# Patient Record
Sex: Male | Born: 1990 | Race: White | Hispanic: Yes | Marital: Married | Smoking: Current every day smoker
Health system: Southern US, Community
[De-identification: ages and names within clinical notes are randomized; demographics above are authoritative.]

## PROBLEM LIST (undated history)

## (undated) DIAGNOSIS — L409 Psoriasis, unspecified: Secondary | ICD-10-CM

---

## 2009-10-28 ENCOUNTER — Emergency Department: Payer: Self-pay | Admitting: Emergency Medicine

## 2013-07-30 ENCOUNTER — Emergency Department: Payer: Self-pay | Admitting: Emergency Medicine

## 2013-07-30 LAB — URINALYSIS, COMPLETE
Bacteria: NONE SEEN
Bilirubin,UR: NEGATIVE
Blood: NEGATIVE
Glucose,UR: NEGATIVE mg/dL (ref 0–75)
Ketone: NEGATIVE
Leukocyte Esterase: NEGATIVE
Nitrite: NEGATIVE
RBC,UR: 2 /HPF (ref 0–5)
Specific Gravity: 1.021 (ref 1.003–1.030)
Squamous Epithelial: <1

## 2015-02-10 ENCOUNTER — Emergency Department
Admit: 2015-02-10 | Disposition: A | Discharge status: Home / Self Care | Payer: Self-pay | Admitting: Emergency Medicine

## 2015-02-10 LAB — URINALYSIS, COMPLETE
BACTERIA: NONE SEEN
BILIRUBIN, UR: NEGATIVE
Blood: NEGATIVE
GLUCOSE, UR: NEGATIVE mg/dL (ref 0–75)
KETONE: NEGATIVE
Leukocyte Esterase: NEGATIVE
Nitrite: NEGATIVE
Ph: 6 (ref 4.5–8.0)
Protein: NEGATIVE
SPECIFIC GRAVITY: 1.026 (ref 1.003–1.030)

## 2015-04-20 ENCOUNTER — Encounter: Payer: Self-pay | Admitting: Emergency Medicine

## 2015-04-20 ENCOUNTER — Emergency Department
Admission: EM | Admit: 2015-04-20 | Discharge: 2015-04-20 | Disposition: A | Discharge status: Home / Self Care | Payer: Self-pay | Attending: Emergency Medicine | Admitting: Emergency Medicine

## 2015-04-20 DIAGNOSIS — L408 Other psoriasis: Secondary | ICD-10-CM

## 2015-04-20 DIAGNOSIS — L409 Psoriasis, unspecified: Secondary | ICD-10-CM | POA: Insufficient documentation

## 2015-04-20 DIAGNOSIS — Z720 Tobacco use: Secondary | ICD-10-CM | POA: Insufficient documentation

## 2015-04-20 DIAGNOSIS — L218 Other seborrheic dermatitis: Secondary | ICD-10-CM | POA: Insufficient documentation

## 2015-04-20 DIAGNOSIS — R109 Unspecified abdominal pain: Secondary | ICD-10-CM | POA: Insufficient documentation

## 2015-04-20 LAB — URINALYSIS COMPLETE WITH MICROSCOPIC (ARMC ONLY)
BACTERIA UA: NONE SEEN
BILIRUBIN URINE: NEGATIVE
GLUCOSE, UA: NEGATIVE mg/dL
HGB URINE DIPSTICK: NEGATIVE
KETONES UR: NEGATIVE mg/dL
LEUKOCYTES UA: NEGATIVE
Nitrite: NEGATIVE
Protein, ur: NEGATIVE mg/dL
Specific Gravity, Urine: 1.02 (ref 1.005–1.030)
pH: 5 (ref 5.0–8.0)

## 2015-04-20 MED ORDER — IBUPROFEN 800 MG PO TABS: 800.0000 mg | ORAL_TABLET | Freq: Three times a day (TID) | ORAL | Status: AC | PRN

## 2015-04-20 MED ORDER — CYCLOBENZAPRINE HCL 10 MG PO TABS: 10.0000 mg | ORAL_TABLET | Freq: Three times a day (TID) | ORAL | Status: DC | PRN

## 2015-04-20 NOTE — ED Notes (Signed)
Pt to ed with c/o intermittent pain in lower back x 2 years.  Pt states recently has been lifting heavy objects at work and the pain has returned. Pt ambulates with ease.

## 2015-04-20 NOTE — Discharge Instructions (Signed)
Flank Pain °Flank pain is pain in your side. The flank is the area of your side between your upper belly (abdomen) and your back. Pain in this area can be caused by many different things. °HOME CARE °Home care and treatment will depend on the cause of your pain. °· Rest as told by your doctor. °· Drink enough fluids to keep your pee (urine) clear or pale yellow.   °· Only take medicine as told by your doctor. °· Tell your doctor about any changes in your pain. °· Follow up with your doctor. °GET HELP RIGHT AWAY IF:  °· Your pain does not get better with medicine.   °· You have new symptoms or your symptoms get worse. °· Your pain gets worse.   °· You have belly (abdominal) pain.   °· You are short of breath.   °· You always feel sick to your stomach (nauseous).   °· You keep throwing up (vomiting).   °· You have puffiness (swelling) in your belly.   °· You feel light-headed or you pass out (faint).   °· You have blood in your pee. °· You have a fever or lasting symptoms for more than 2-3 days. °· You have a fever and your symptoms suddenly get worse. °MAKE SURE YOU:  °· Understand these instructions. °· Will watch your condition. °· Will get help right away if you are not doing well or get worse. °Document Released: 07/25/2008 Document Revised: 03/02/2014 Document Reviewed: 05/30/2012 °ExitCare® Patient Information ©2015 [employer]. This information is not intended to replace advice given to you by your health care provider. Make sure you discuss any questions you have with your health care provider. ° °

## 2015-04-20 NOTE — ED Provider Notes (Signed)
[facility] Emergency Department Provider Note  ____________________________________________  Time seen: Approximately 7:30 AM  I have reviewed the triage vital signs and the nursing notes.   HISTORY  Chief Complaint Back Pain    HPI Ronnie Garner is a 24 y.o. male complaining of low back pain for 2 years. Patient state recently he has performed heavy lifting at work and the pain has returned. Patient is located in the right flank. Patient denies any radicular component to this pain he denies any bladder or bowel dysfunction. No palliative measures taken since onset. Patient is rating his pain as 8/10.   History reviewed. No pertinent past medical history.  There are no active problems to display for this patient.   History reviewed. No pertinent past surgical history.  Current Outpatient Rx  Name  Route  Sig  Dispense  Refill  . cyclobenzaprine (FLEXERIL) 10 MG tablet   Oral   Take 1 tablet (10 mg total) by mouth every 8 (eight) hours as needed for muscle spasms.   15 tablet   0   . ibuprofen (ADVIL,MOTRIN) 800 MG tablet   Oral   Take 1 tablet (800 mg total) by mouth every 8 (eight) hours as needed for moderate pain.   15 tablet   0     Allergies Review of patient's allergies indicates no known allergies.  History reviewed. No pertinent family history.  Social History History  Substance Use Topics  . Smoking status: Current Every Day Smoker  . Smokeless tobacco: Not on file  . Alcohol Use: Yes    Review of Systems Constitutional: No fever/chills Eyes: No visual changes. ENT: No sore throat. Cardiovascular: Denies chest pain. Respiratory: Denies shortness of breath. Gastrointestinal: No abdominal pain.  No nausea, no vomiting.  No diarrhea.  No constipation. Genitourinary: Negative for dysuria. Musculoskeletal: Right flank pain Skin: Positive for rash for head behind the ears and bilateral knee Neurological: Negative for  headaches, focal weakness or numbness. 10-point ROS otherwise negative.  ____________________________________________   PHYSICAL EXAM:  VITAL SIGNS: ED Triage Vitals  Enc Vitals Group     BP 04/20/15 0716 145/82 mmHg     Pulse Rate 04/20/15 0716 62     Resp 04/20/15 0716 18     Temp 04/20/15 0716 97.7 F (36.5 C)     Temp Source 04/20/15 0716 Oral     SpO2 04/20/15 0716 96 %     Weight 04/20/15 0716 220 lb (99.791 kg)     Height 04/20/15 0716  (1.702 m)     Head Cir --      Peak Flow --      Pain Score 04/20/15 0717 8     Pain Loc --      Pain Edu? --      Excl. in GC? --     Constitutional: Alert and oriented. Well appearing and in no acute distress. Eyes: Conjunctivae are normal. PERRL. EOMI. Head: Atraumatic. Nose: No congestion/rhinnorhea. Mouth/Throat: Mucous membranes are moist.  Oropharynx non-ery  No cervical spine tenderness to palpation. Neck: No stridor.  Hematological/Lymphatic/Immunilogical: No cervical lymphadenopathy. Cardiovascular: Normal rate, regular rhythm. Grossly normal heart sounds.  Good peripheral circulation. Respiratory: Normal respiratory effort.  No retractions. Lungs CTAB. Gastrointestinal: Soft and nontender. No distention. No abdominal bruits. Left CVA tenderness.  Musculoskeletal: No lower extremity tenderness nor edema.  No joint effusions. Neurologic:  Normal speech and language. No gross focal neurologic deficits are appreciated. Speech is normal. No gait instability.  Skin:  Skin is warm, dry and intact. Macular scaly rash on the forehead bilaterally ears and knees. Psychiatric: Mood and affect are normal. Speech and behavior are normal.  ____________________________________________   LABS (all labs ordered are listed, but only abnormal results are displayed)  Labs Reviewed  URINALYSIS COMPLETEWITH MICROSCOPIC ([facility] ONLY) - Abnormal; Notable for the following:    Color, Urine YELLOW (*)    APPearance CLEAR (*)    Squamous  Epithelial / LPF 0-5 (*)    All other components within normal limits   ____________________________________________  EKG   ____________________________________________  RADIOLOGY   ____________________________________________   PROCEDURES  Procedure(s) performed: None  Critical Care performed: No  ____________________________________________   INITIAL IMPRESSION / ASSESSMENT AND PLAN / ED COURSE  Pertinent labs & imaging results that were available during my care of the patient were reviewed by me and considered in my medical decision making (see chart for details).  Right flank pain.  ____________________________________________   FINAL CLINICAL IMPRESSION(S) / ED DIAGNOSES  Final diagnoses:  Right flank pain  Psoriasiform seborrheic dermatitis      Joni Reining, PA-C 04/20/15 1751  Gayla Doss, MD 04/20/15 [phone]

## 2015-04-20 NOTE — ED Notes (Signed)
Assessment per PA 

## 2015-06-08 ENCOUNTER — Emergency Department
Admission: EM | Admit: 2015-06-08 | Discharge: 2015-06-08 | Disposition: A | Discharge status: Home / Self Care | Payer: Self-pay | Attending: Emergency Medicine | Admitting: Emergency Medicine

## 2015-06-08 ENCOUNTER — Encounter: Payer: Self-pay | Admitting: Emergency Medicine

## 2015-06-08 DIAGNOSIS — M549 Dorsalgia, unspecified: Secondary | ICD-10-CM

## 2015-06-08 DIAGNOSIS — M545 Low back pain: Secondary | ICD-10-CM | POA: Insufficient documentation

## 2015-06-08 DIAGNOSIS — R109 Unspecified abdominal pain: Secondary | ICD-10-CM | POA: Insufficient documentation

## 2015-06-08 DIAGNOSIS — G8929 Other chronic pain: Secondary | ICD-10-CM | POA: Insufficient documentation

## 2015-06-08 DIAGNOSIS — Z720 Tobacco use: Secondary | ICD-10-CM | POA: Insufficient documentation

## 2015-06-08 LAB — URINALYSIS COMPLETE WITH MICROSCOPIC (ARMC ONLY)
BACTERIA UA: NONE SEEN
BILIRUBIN URINE: NEGATIVE
GLUCOSE, UA: NEGATIVE mg/dL
HGB URINE DIPSTICK: NEGATIVE
KETONES UR: NEGATIVE mg/dL
Leukocytes, UA: NEGATIVE
NITRITE: NEGATIVE
PROTEIN: 30 mg/dL — AB
SPECIFIC GRAVITY, URINE: 1.028 (ref 1.005–1.030)
pH: 5 (ref 5.0–8.0)

## 2015-06-08 MED ORDER — METHOCARBAMOL 500 MG PO TABS: 1500.0000 mg | ORAL_TABLET | Freq: Three times a day (TID) | ORAL | Status: AC | PRN

## 2015-06-08 MED ORDER — TRAMADOL HCL 50 MG PO TABS: 50.0000 mg | ORAL_TABLET | Freq: Four times a day (QID) | ORAL | Status: AC | PRN

## 2015-06-08 NOTE — ED Provider Notes (Signed)
[facility] Emergency Department Provider Note ____________________________________________  Time seen: Approximately 7:28 AM  I have reviewed the triage vital signs and the nursing notes.   HISTORY  Chief Complaint Flank Pain and Back Pain   HPI Ronnie Garner is a 24 y.o. male who presents to the emergency department for evaluation of right lower back pain. He states that this pain is been present off and on for the past 2 years. He states that he has been here before for the same pain. He states that he has had negative x-rays and we have tested his urine every time but we have found nothing wrong. He states that he has been taking is ibuprofen and using pain patches with minimal relief. He states typically 2 days off work is enough to relieve the pain. Pain is worse with movement.   History reviewed. No pertinent past medical history.  There are no active problems to display for this patient.   History reviewed. No pertinent past surgical history.  Current Outpatient Rx  Name  Route  Sig  Dispense  Refill  . ibuprofen (ADVIL,MOTRIN) 800 MG tablet   Oral   Take 1 tablet (800 mg total) by mouth every 8 (eight) hours as needed for moderate pain.   15 tablet   0   . methocarbamol (ROBAXIN) 500 MG tablet   Oral   Take 3 tablets (1,500 mg total) by mouth every 8 (eight) hours as needed for muscle spasms.   30 tablet   0   . traMADol (ULTRAM) 50 MG tablet   Oral   Take 1 tablet (50 mg total) by mouth every 6 (six) hours as needed.   12 tablet   0     Allergies Review of patient's allergies indicates no known allergies.  No family history on file.  Social History History  Substance Use Topics  . Smoking status: Current Every Day Smoker -- 0.50 packs/day    Types: Cigarettes  . Smokeless tobacco: Never Used  . Alcohol Use: Yes    Review of Systems Constitutional: No recent illness. Eyes: No visual changes. ENT: No sore  throat. Cardiovascular: Denies chest pain or palpitations. Respiratory: Denies shortness of breath. Gastrointestinal: No abdominal pain.  Genitourinary: Negative for dysuria. Musculoskeletal: Pain in right lower back without radiation. Skin: Negative for rash. Neurological: Negative for headaches, focal weakness or numbness. 10-point ROS otherwise negative.  ____________________________________________   PHYSICAL EXAM:  VITAL SIGNS: ED Triage Vitals  Enc Vitals Group     BP 06/08/15 0644 142/78 mmHg     Pulse Rate 06/08/15 0644 65     Resp 06/08/15 0644 18     Temp 06/08/15 0644 98.3 F (36.8 C)     Temp Source 06/08/15 0644 Oral     SpO2 06/08/15 0644 97 %     Weight 06/08/15 0644 215 lb (97.523 kg)     Height 06/08/15 0644 5\' 7"  (1.702 m)     Head Cir --      Peak Flow --      Pain Score 06/08/15 0645 8     Pain Loc --      Pain Edu? --      Excl. in GC? --     Constitutional: Alert and oriented. Well appearing and in no acute distress. Eyes: Conjunctivae are normal. EOMI. Head: Atraumatic. Nose: No congestion/rhinnorhea. Neck: No stridor.  Respiratory: Normal respiratory effort.   Musculoskeletal: No tenderness upon palpation. Straight leg raise is negative.  Neurologic:  Normal speech and language. No gross focal neurologic deficits are appreciated. Speech is normal. No gait instability. Skin:  Skin is warm, dry and intact. Atraumatic. Psychiatric: Mood and affect are normal. Speech and behavior are normal.  ____________________________________________   LABS (all labs ordered are listed, but only abnormal results are displayed)  Labs Reviewed  URINALYSIS COMPLETEWITH MICROSCOPIC ([facility] ONLY) - Abnormal; Notable for the following:    Color, Urine YELLOW (*)    APPearance CLEAR (*)    Protein, ur 30 (*)    Squamous Epithelial / LPF 0-5 (*)    All other components within normal limits   ____________________________________________  RADIOLOGY  Not  indicated ____________________________________________   PROCEDURES  Procedure(s) performed: None   ____________________________________________   INITIAL IMPRESSION / ASSESSMENT AND PLAN / ED COURSE  Pertinent labs & imaging results that were available during my care of the patient were reviewed by me and considered in my medical decision making (see chart for details).  Prescriptions for Robaxin and Tylenol written. Patient is to follow-up with orthopedics or a spine specialist for further evaluation. He was advised to return to the emergency department for symptoms that change or worsen in left is unable to schedule an appointment. ____________________________________________   FINAL CLINICAL IMPRESSION(S) / ED DIAGNOSES  Final diagnoses:  Chronic back pain greater than 3 months duration      Chinita Pester, FNP 06/08/15 1610  Sharman Cheek, MD 06/08/15 1348

## 2015-06-08 NOTE — ED Notes (Signed)
Pt presents to ED with right sided flank/back pain with slight nausea. Pt denies known injury. reports pain has been ongoing for the past 2 years but worsened last night around 1800. Pt states he has been seen in this ED for the same and had kidney stones ruled out. States his MD is not sure what is causing his pain. Pt states since he was hurting so badly this morning he didn't want to go to work so he came in here instead. Pt alert and calm at this time with on increased work of breathing or acute distress noted.

## 2015-06-08 NOTE — ED Notes (Signed)
States he has had intermittent right lower back,flank pain for about 2 mos. Min relief with ibuprofen and patches. Denies recent injury or urinary sx's. Ambulates well to treatment room

## 2015-09-21 IMAGING — CR DG THORACIC SPINE 2-3V
1 series · 4 of 4 positions shown · non-contrast
Comparison: None.

CLINICAL DATA: Chronic dorsalgia.  No recent trauma

EXAM:
THORACIC SPINE - 3 VIEW

[Series 1: t thoracic spine ap · 0.14mm/px · 4 of 4 slices shown]
[im 1/4]
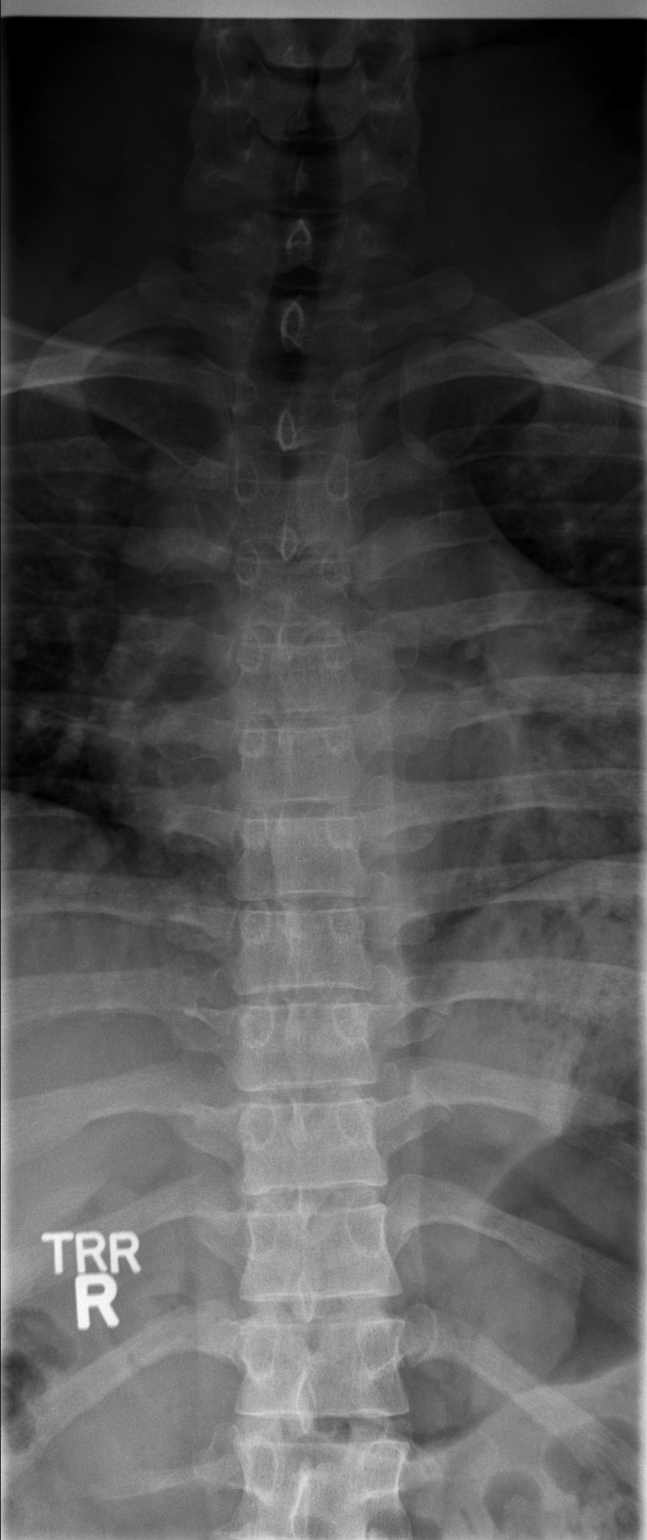
[im 2/4]
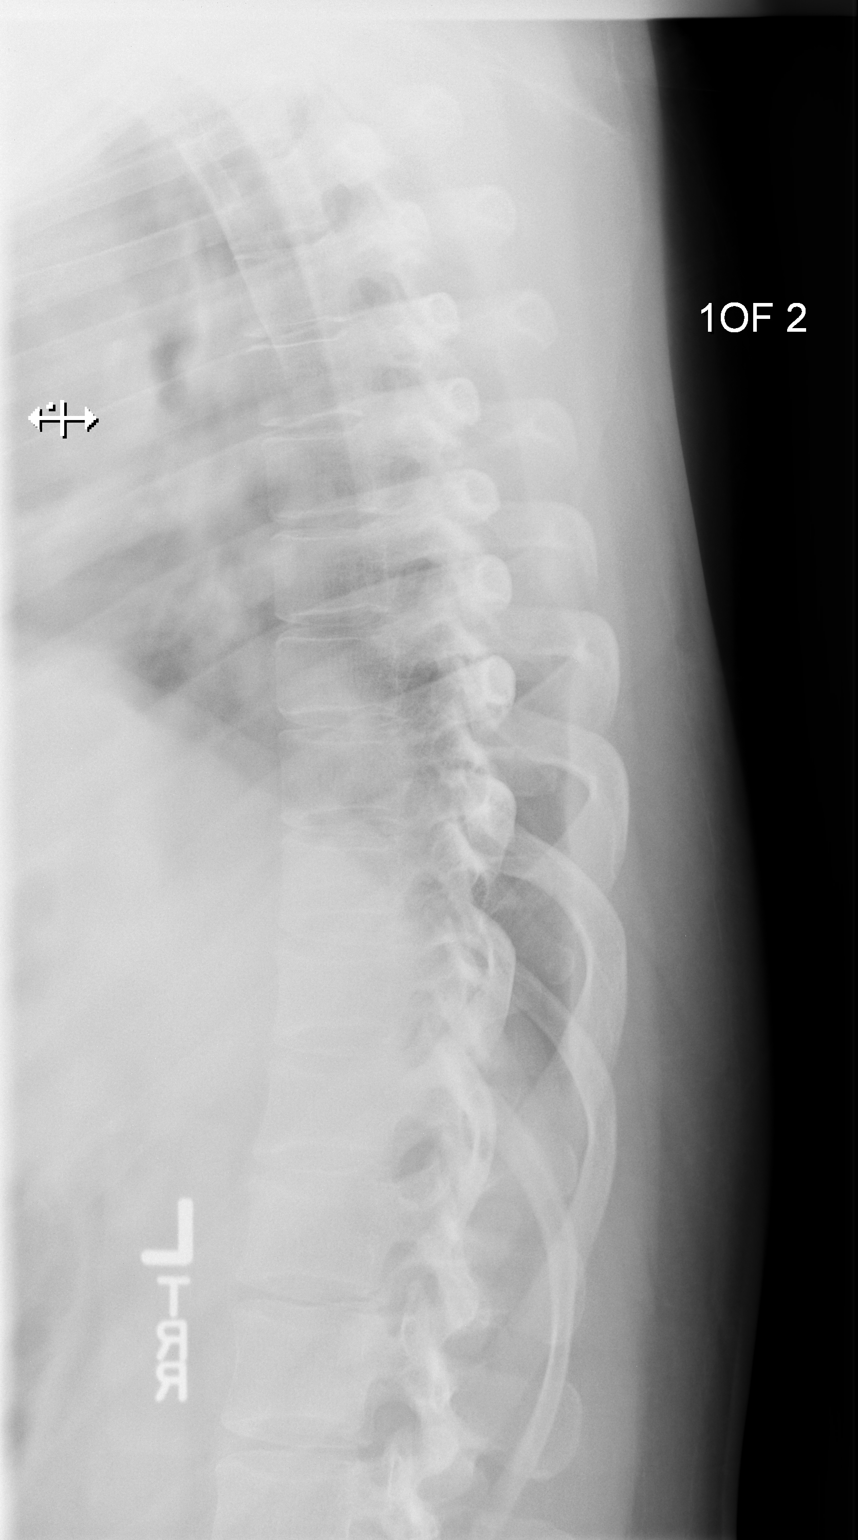
[im 3/4]
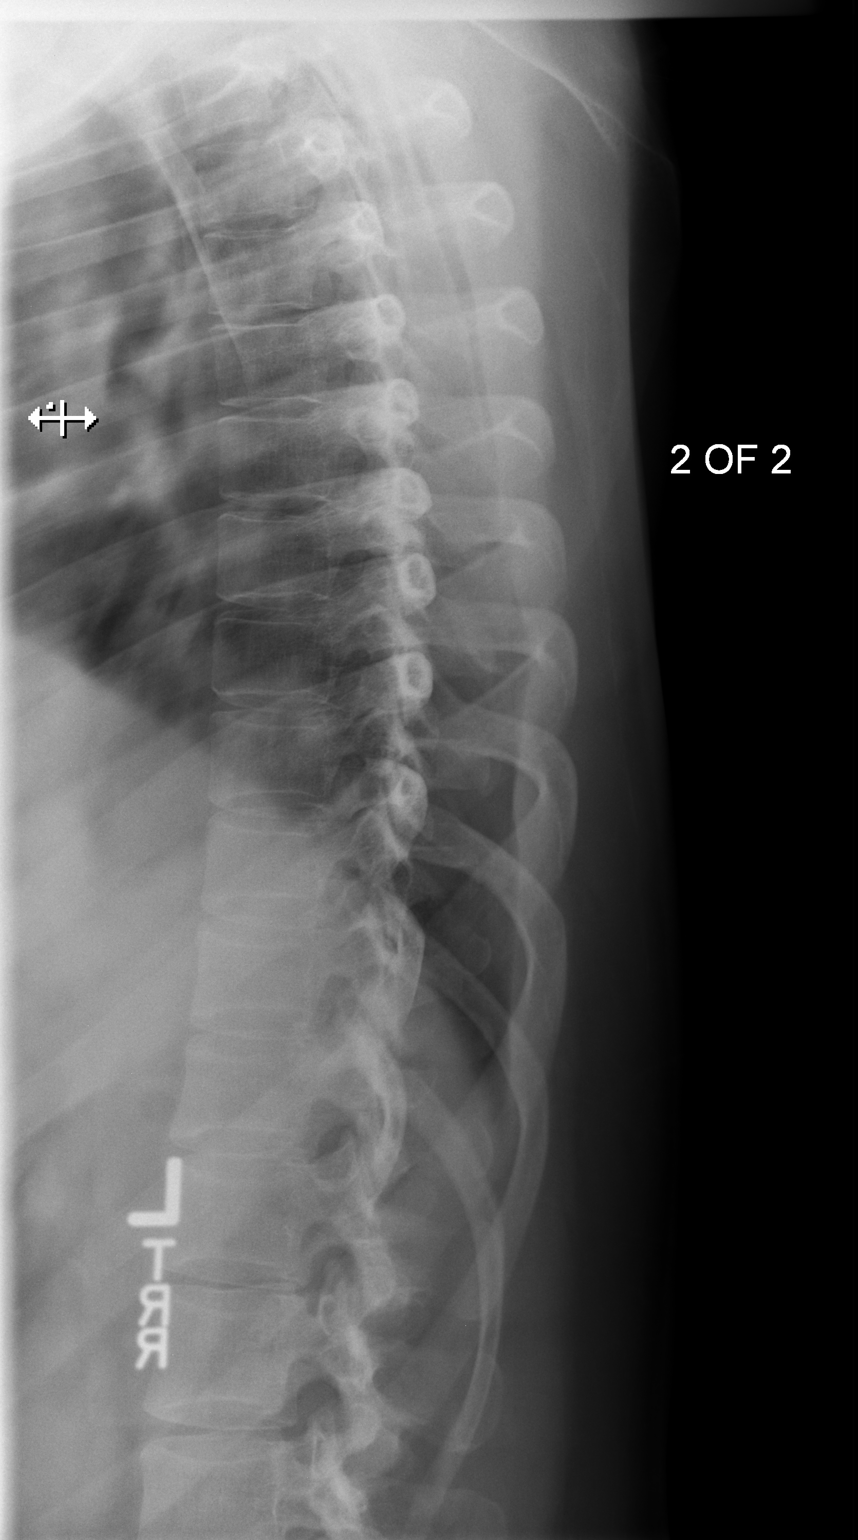
[im 4/4]
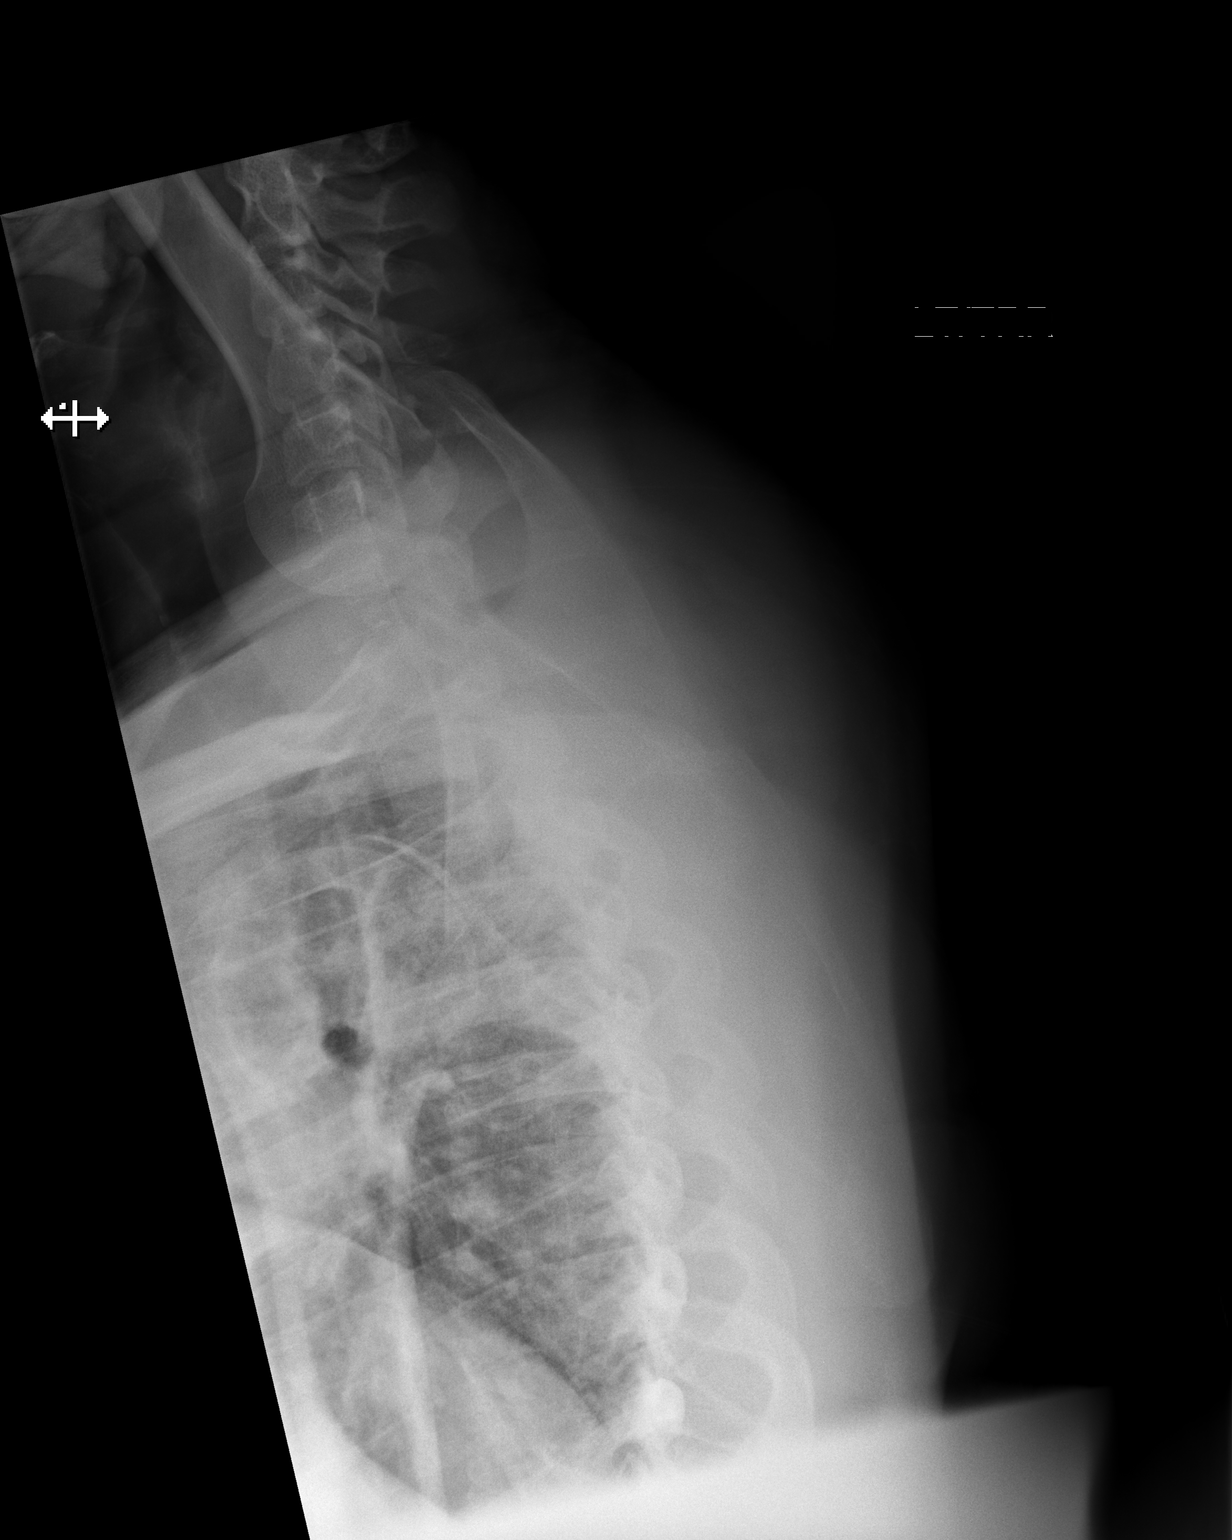

[4 of 4 positions shown; findings below may reference images not displayed]

FINDINGS: Frontal, lateral, and swimmer's views were obtained. There is no
fracture or spondylolisthesis. Disc spaces appear intact. No erosive
change.
IMPRESSION: No fracture or spondylolisthesis.  No appreciable arthropathy.

## 2017-05-21 DIAGNOSIS — Z79899 Other long term (current) drug therapy: Secondary | ICD-10-CM | POA: Diagnosis not present

## 2017-05-21 DIAGNOSIS — L400 Psoriasis vulgaris: Secondary | ICD-10-CM | POA: Diagnosis not present

## 2017-09-28 DIAGNOSIS — Z0000 Encounter for general adult medical examination without abnormal findings: Secondary | ICD-10-CM | POA: Diagnosis not present

## 2017-09-28 DIAGNOSIS — Z7689 Persons encountering health services in other specified circumstances: Secondary | ICD-10-CM | POA: Diagnosis not present

## 2017-09-28 DIAGNOSIS — L308 Other specified dermatitis: Secondary | ICD-10-CM | POA: Diagnosis not present

## 2018-06-25 DIAGNOSIS — L400 Psoriasis vulgaris: Secondary | ICD-10-CM | POA: Diagnosis not present

## 2018-06-25 DIAGNOSIS — Z79899 Other long term (current) drug therapy: Secondary | ICD-10-CM | POA: Diagnosis not present

## 2020-01-12 ENCOUNTER — Other Ambulatory Visit: Payer: Self-pay

## 2020-01-12 ENCOUNTER — Encounter: Payer: Self-pay | Admitting: Emergency Medicine

## 2020-01-12 ENCOUNTER — Emergency Department
Admission: EM | Admit: 2020-01-12 | Discharge: 2020-01-12 | Disposition: A | Discharge status: Home / Self Care | Payer: Self-pay

## 2020-01-12 NOTE — ED Notes (Signed)
States he has gotten in touch with md  He left

## 2020-01-12 NOTE — ED Triage Notes (Signed)
Presents with lower back pain since yesterday  States pain started yesterday after lifting something  Increases pain with walking

## 2020-06-30 ENCOUNTER — Ambulatory Visit: Payer: Self-pay | Admitting: Dermatology

## 2023-04-20 ENCOUNTER — Emergency Department: Payer: Self-pay

## 2023-04-20 ENCOUNTER — Other Ambulatory Visit: Payer: Self-pay

## 2023-04-20 ENCOUNTER — Emergency Department
Admission: EM | Admit: 2023-04-20 | Discharge: 2023-04-20 | Disposition: A | Discharge status: Home / Self Care | Payer: Self-pay | Attending: Student in an Organized Health Care Education/Training Program | Admitting: Student in an Organized Health Care Education/Training Program

## 2023-04-20 DIAGNOSIS — W228XXA Striking against or struck by other objects, initial encounter: Secondary | ICD-10-CM | POA: Diagnosis not present

## 2023-04-20 DIAGNOSIS — S0101XA Laceration without foreign body of scalp, initial encounter: Secondary | ICD-10-CM | POA: Insufficient documentation

## 2023-04-20 HISTORY — DX: Psoriasis, unspecified: L40.9

## 2023-04-20 MED ORDER — CEPHALEXIN 500 MG PO CAPS: 500.0000 mg | ORAL_CAPSULE | Freq: Four times a day (QID) | ORAL | 0 refills | Status: AC

## 2023-04-20 NOTE — ED Provider Notes (Signed)
[facility] Provider Note  Patient Contact: 8:58 PM (approximate)   History   Head Laceration   HPI  Ronnie Garner is a 32 y.o. male presents to the emergency department with a 2 cm vertex scalp laceration sustained after a socket wrench fell and patient.  No loss of consciousness occurred and patient is not having any neck pain.  No numbness or tingling in the upper and lower extremities.  Patient reports his tetanus status is up-to-date.      Physical Exam   Triage Vital Signs: ED Triage Vitals  Enc Vitals Group     BP 04/20/23 1845 (!) 153/96     Pulse Rate 04/20/23 1845 95     Resp 04/20/23 1845 18     Temp 04/20/23 1845 98.4 F (36.9 C)     Temp Source 04/20/23 1845 Oral     SpO2 04/20/23 1845 95 %     Weight 04/20/23 1846 275 lb (124.7 kg)     Height 04/20/23 1846 5\' 7"  (1.702 m)     Head Circumference --      Peak Flow --      Pain Score 04/20/23 1846 0     Pain Loc --      Pain Edu? --      Excl. in GC? --     Most recent vital signs: Vitals:   04/20/23 1845 04/20/23 2159  BP: (!) 153/96 (!) 147/92  Pulse: 95 78  Resp: 18 18  Temp: 98.4 F (36.9 C)   SpO2: 95% 98%     General: Alert and in no acute distress. Eyes:  PERRL. EOMI. Head: No acute traumatic findings ENT:      Nose: No congestion/rhinnorhea.      Mouth/Throat: Mucous membranes are moist. Neck: No stridor. No cervical spine tenderness to palpation. Cardiovascular:  Good peripheral perfusion Respiratory: Normal respiratory effort without tachypnea or retractions. Lungs CTAB. Good air entry to the bases with no decreased or absent breath sounds. Gastrointestinal: Bowel sounds 4 quadrants. Soft and nontender to palpation. No guarding or rigidity. No palpable masses. No distention. No CVA tenderness. Musculoskeletal: Full range of motion to all extremities.  Neurologic:  No gross focal neurologic deficits are appreciated.  Skin: Patient has a 2 cm vertex  scalp laceration.    ED Results / Procedures / Treatments   Labs (all labs ordered are listed, but only abnormal results are displayed) Labs Reviewed - No data to display    RADIOLOGY  I personally viewed and evaluated these images as part of my medical decision making, as well as reviewing the written report by the radiologist.  ED Provider Interpretation: CT head and CT cervical spine shows no acute abnormality.   PROCEDURES:  Critical Care performed: No  ..Laceration Repair  Date/Time: 04/20/2023 9:00 PM  Performed by: Orvil Feil, PA-C Authorized by: Orvil Feil, PA-C   Consent:    Consent obtained:  Verbal   Risks discussed:  Infection and pain Universal protocol:    Procedure explained and questions answered to patient or proxy's satisfaction: yes     Patient identity confirmed:  Verbally with patient Anesthesia:    Anesthesia method:  None Laceration details:    Location:  Scalp   Scalp location:  Crown   Length (cm):  2   Depth (mm):  5 Pre-procedure details:    Preparation:  Patient was prepped and draped in usual sterile fashion Exploration:    Limited defect  created (wound extended): no     Contaminated: yes   Treatment:    Area cleansed with:  Povidone-iodine   Amount of cleaning:  Standard   Debridement:  None Skin repair:    Repair method:  Staples Approximation:    Approximation:  Close Repair type:    Repair type:  Simple Post-procedure details:    Dressing:  Non-adherent dressing    MEDICATIONS ORDERED IN ED: Medications - No data to display   IMPRESSION / MDM / ASSESSMENT AND PLAN / ED COURSE  I reviewed the triage vital signs and the nursing notes.                              Assessment and plan Scalp laceration 32 year old male presents to the emergency department after he had a socket wrench fall on him after he moved a ladder.  Patient was hypertensive at triage but vital signs were otherwise reassuring.  On  exam, patient was alert, active and nontoxic-appearing.  Laceration repair occurred in the emergency department without complication.  Will obtain head and neck CT and will reassess.  CTs of the head and cervical spine unremarkable.  Patient education regarding wound care was given.  Tetanus status up-to-date.  All patient questions were answered.   FINAL CLINICAL IMPRESSION(S) / ED DIAGNOSES   Final diagnoses:  Laceration of scalp, initial encounter     Rx / DC Orders   ED Discharge Orders          Ordered    cephALEXin (KEFLEX) 500 MG capsule  4 times daily        04/20/23 2139             Note:  This document was prepared using Dragon voice recognition software and may include unintentional dictation errors.   Pia Mau Archie, PA-C 04/20/23 2310    Willy Eddy, MD 04/21/23 [phone]

## 2023-04-20 NOTE — ED Triage Notes (Signed)
Pt to er, pt states that he was at work, states that he got hit in the head with a ratchet that was on a ladder and the tool fell and hit him on the head, denies loc, pt awake and oriented, bleeding stopped at this time, pt has apox 1 inch lac to the top of his head.

## 2023-04-20 NOTE — Discharge Instructions (Signed)
Take Keflex four times daily for the next seven days.  

## 2024-09-15 ENCOUNTER — Encounter: Payer: Self-pay | Admitting: Dermatology

## 2024-09-15 ENCOUNTER — Ambulatory Visit: Payer: Self-pay | Admitting: Dermatology

## 2024-09-15 DIAGNOSIS — Z7189 Other specified counseling: Secondary | ICD-10-CM

## 2024-09-15 DIAGNOSIS — L409 Psoriasis, unspecified: Secondary | ICD-10-CM | POA: Diagnosis not present

## 2024-09-15 DIAGNOSIS — Z79899 Other long term (current) drug therapy: Secondary | ICD-10-CM | POA: Insufficient documentation

## 2024-09-15 MED ORDER — SKYRIZI PEN 150 MG/ML ~~LOC~~ SOAJ: 150.0000 mg | SUBCUTANEOUS | 6 refills | Status: DC

## 2024-09-15 MED ORDER — CLOBETASOL PROPIONATE 0.05 % EX OINT: 1.0000 | TOPICAL_OINTMENT | Freq: Two times a day (BID) | CUTANEOUS | 5 refills | Status: AC

## 2024-09-15 MED ORDER — TRIAMCINOLONE ACETONIDE 0.1 % EX OINT: 1.0000 | TOPICAL_OINTMENT | Freq: Two times a day (BID) | CUTANEOUS | 5 refills | Status: AC | PRN

## 2024-09-15 NOTE — Progress Notes (Signed)
 New Patient Visit   Subjective  Ronnie Garner is a 33 y.o. male who presents for the following: psoriasis, mostly at knees, groin and scalp. Patient was on Skyrizi but has been off of it for about 8 months. Previous dermatologist was Dr. Arlyss in [place].  Has used Cosentyx which completely cleared but insurance stopped covering and Taltz did not seem to help. Patient was only on Skyrizi for about 3 doses but did not clear.   No joint pain. Currently using OTC Aquaphor or Vaseline.  The following portions of the chart were reviewed this encounter and updated as appropriate: medications, allergies, medical history  Review of Systems:  No other skin or systemic complaints except as noted in HPI or Assessment and Plan.  Objective  Well appearing patient in no apparent distress; mood and affect are within normal limits.   A focused examination was performed of the following areas: Scalp, legs  Relevant exam findings are noted in the Assessment and Plan.    Assessment & Plan   PSORIASIS, severe with nail involvement Exam: Well-demarcated erythematous papules/plaques with silvery scale, guttate pink scaly papules knees scalp arm within tattoo. Pitting at nail plates.  5-10% BSA.  Chronic and persistent condition with duration or expected duration over one year. Condition is bothersome/symptomatic for patient. Currently flared.  patient denies joint pain  Psoriasis is a chronic non-curable, but treatable genetic/hereditary disease that may have other systemic features affecting other organ systems such as joints (Psoriatic Arthritis). It is associated with an increased risk of inflammatory bowel disease, heart disease, non-alcoholic fatty liver disease, and depression.  Treatments include light and laser treatments; topical medications; and systemic medications including oral and injectables.  Treatment Plan: Will plan to start Skyrizi pending labs.  Sample of Skyrizi given to  patient today to self-inject 150 mg today then in 4 weeks then every 12 weeks NDC 9925-7899-29 Lot # [id number]   Exp: 09/2025  Start clobetasol 0.05% ointment twice daily to aa prn. Avoid applying to face, groin, and axilla. Use as directed. Long-term use can cause thinning of the skin. Start TMC 0.1% ointment twice daily to aa groin prn  Reviewed risks of biologics including immunosuppression, infections (i.e. TB reactivation), injection site reaction, and failure to improve condition. Goal is control of skin condition, not cure.  Some older biologics such as Humira and Enbrel may slightly increase risk of malignancy and may worsen congestive heart failure.  Taltz, Cosentyx, and Bimzelx may cause inflammatory bowel disease to flare or may increase incidence of yeast infections. Skyrizi, Tremfya, and Stelara may also slightly increase risk of infection. The use of biologics requires long term medication management, including periodic office visits, annual TB screening test and monitoring of blood work.   Topical steroids (such as triamcinolone, fluocinolone, fluocinonide, mometasone, clobetasol, halobetasol, betamethasone, hydrocortisone) can cause thinning and lightening of the skin if they are used for too long in the same area. Your physician has selected the right strength medicine for your problem and area affected on the body. Please use your medication only as directed by your physician to prevent side effects.   PSORIASIS   Related Medications clobetasol ointment (TEMOVATE) 0.05 % Apply 1 Application topically 2 (two) times daily. Prn psoriasis. Avoid applying to face, groin, and axilla. Use as directed. Long-term use can cause thinning of the skin. triamcinolone ointment (KENALOG) 0.1 % Apply 1 Application topically 2 (two) times daily as needed (Rash). risankizumab-rzaa (SKYRIZI PEN) 150 MG/ML pen Inject 1 mL (  150 mg total) into the skin as directed. Every 12 weeks for  maintenance. LONG-TERM USE OF HIGH-RISK MEDICATION   Related Procedures Comprehensive metabolic panel with GFR CBC with Differential/Platelet Hepatitis B surface antibody,qualitative Hepatitis B surface antigen Hepatitis C antibody HIV Antibody (routine testing w rflx) QuantiFERON-TB Gold Plus COUNSELING AND COORDINATION OF CARE   MEDICATION MANAGEMENT   PSORIASIS OF NAIL    Return in about 4 months (around 01/13/2025) for with Dr. Claudene, Psoriasis.  LILLETTE Lonell Drones, RMA, am acting as scribe for Boneta Claudene, MD .   Documentation: I have reviewed the above documentation for accuracy and completeness, and I agree with the above.  Boneta Claudene, MD

## 2024-09-15 NOTE — Patient Instructions (Addendum)
 [facility] Your prescription has been sent to [facility].  [facility] will call you to obtain additional  information needed to fill your prescription such as:  Additional insurance information Payment for any out-of-pocket expense (may require credit card) Address for drug delivery shipment  A timely response to the pharmacy may help you obtain your medication more quickly. Please call [facility] at [phone].   Due to recent changes in healthcare laws, you may see results of your pathology and/or laboratory studies on MyChart before the doctors have had a chance to review them. We understand that in some cases there may be results that are confusing or concerning to you. Please understand that not all results are received at the same time and often the doctors may need to interpret multiple results in order to provide you with the best plan of care or course of treatment. Therefore, we ask that you please give us  2 business days to thoroughly review all your results before contacting the office for clarification. Should we see a critical lab result, you will be contacted sooner.   If You Need Anything After Your Visit  If you have any questions or concerns for your doctor, please call our main line at [phone] and press option 4 to reach your doctor's medical assistant. If no one answers, please leave a voicemail as directed and we will return your call as soon as possible. Messages left after 4 pm will be answered the following business day.   You may also send us  a message via MyChart. We typically respond to MyChart messages within 1-2 business days.  For prescription refills, please ask your pharmacy to contact our office. Our fax number is [phone].  If you have an urgent issue when the clinic is closed that cannot wait until the next business day, you can page your doctor at the number below.    Please note that while we do our  best to be available for urgent issues outside of office hours, we are not available 24/7.   If you have an urgent issue and are unable to reach us , you may choose to seek medical care at your doctor's office, retail clinic, urgent care center, or emergency room.  If you have a medical emergency, please immediately call 911 or go to the emergency department.  Pager Numbers  - Dr. Hester: [phone]  - Dr. Jackquline: [phone]  - Dr. Claudene: [phone]   - Dr. Raymund: [phone]  In the event of inclement weather, please call our main line at [phone] for an update on the status of any delays or closures.  Dermatology Medication Tips: Please keep the boxes that topical medications come in in order to help keep track of the instructions about where and how to use these. Pharmacies typically print the medication instructions only on the boxes and not directly on the medication tubes.   If your medication is too expensive, please contact our office at [phone] option 4 or send us  a message through MyChart.   We are unable to tell what your co-pay for medications will be in advance as this is different depending on your insurance coverage. However, we may be able to find a substitute medication at lower cost or fill out paperwork to get insurance to cover a needed medication.   If a prior authorization is required to get your medication covered by your insurance company, please allow us  1-2 business days to complete this process.  Drug prices often vary depending on where the prescription is filled and some pharmacies may offer cheaper prices.  The website www.goodrx.com contains coupons for medications through different pharmacies. The prices here do not account for what the cost may be with help from insurance (it may be cheaper with your insurance), but the website can give you the price if you did not use any insurance.  - You can print the associated coupon and take it  with your prescription to the pharmacy.  - You may also stop by our office during regular business hours and pick up a GoodRx coupon card.  - If you need your prescription sent electronically to a different pharmacy, notify our office through [facility] or by phone at [phone] option 4.     Si Usted Necesita Algo Despus de Su Visita  Tambin puede enviarnos un mensaje a travs de Clinical cytogeneticist. Por lo general respondemos a los mensajes de MyChart en el transcurso de 1 a 2 das hbiles.  Para renovar recetas, por favor pida a su farmacia que se ponga en contacto con nuestra oficina. Randi lakes de fax es [place] [phone].  Si tiene un asunto urgente cuando la clnica est cerrada y que no puede esperar hasta el siguiente da hbil, puede llamar/localizar a su doctor(a) al nmero que aparece a continuacin.   Por favor, tenga en cuenta que aunque hacemos todo lo posible para estar disponibles para asuntos urgentes fuera del horario de [place], no estamos disponibles las 24 horas del da, los 7 [address]  [address] de la [place].   Si tiene un problema urgente y no puede comunicarse con nosotros, puede optar por buscar atencin mdica  en el consultorio de su doctor(a), en una clnica privada, en un centro de atencin urgente o en una sala de emergencias.  Si tiene Engineer, drilling, por favor llame inmediatamente al 911 o vaya a la sala de emergencias.  Nmeros de bper  - Dr. Hester: [phone]  - Dra. Jackquline: [phone]  - Dr. Claudene: [phone]  - Dra. Kitts: [phone]  En caso de inclemencias del [place], por favor llame a nuestra lnea principal al [phone] para una actualizacin sobre el estado de cualquier retraso o cierre.  Consejos para la medicacin en dermatologa: Por favor, guarde las cajas en las que vienen los medicamentos de uso tpico para ayudarle a seguir las instrucciones sobre dnde y cmo usarlos. Las farmacias generalmente imprimen las instrucciones  del medicamento slo en las cajas y no directamente en los tubos del [place].   Si su medicamento es muy caro, por favor, pngase en contacto con landry rieger llamando al [phone] y presione la opcin 4 o envenos un mensaje a travs de Clinical cytogeneticist.   No podemos decirle cul ser su copago por los medicamentos por adelantado ya que esto es diferente dependiendo de la cobertura de su seguro. Sin embargo, es posible que podamos encontrar un medicamento sustituto a Audiological scientist un formulario para que el seguro cubra el medicamento que se considera necesario.   Si se requiere una autorizacin previa para que su compaa de seguros malta su medicamento, por favor permtanos de 1 a 2 das hbiles para completar este proceso.  Los precios de los medicamentos varan con frecuencia dependiendo del Environmental consultant de dnde se surte la receta y alguna farmacias pueden ofrecer precios ms baratos.  El sitio web www.goodrx.com tiene cupones para medicamentos de Health and safety inspector. Los precios aqu no tienen en cuenta lo que podra costar con la [employer]  del seguro (puede ser ms barato con su seguro), pero el sitio web puede darle el precio si no Visual merchandiser.  - Puede imprimir el cupn correspondiente y llevarlo con su receta a la farmacia.  - Tambin puede pasar por nuestra oficina durante el horario de atencin regular y Education officer, museum una tarjeta de cupones de GoodRx.  - Si necesita que su receta se enve electrnicamente a una farmacia diferente, informe a nuestra oficina a travs de MyChart de [place] o por telfono llamando al [phone] y presione la opcin 4.

## 2024-09-17 LAB — QUANTIFERON-TB GOLD PLUS
QuantiFERON Mitogen Value: >10 [IU]/mL
QuantiFERON Nil Value: 0 [IU]/mL
QuantiFERON TB1 Ag Value: 0 [IU]/mL
QuantiFERON TB2 Ag Value: 0 [IU]/mL
QuantiFERON-TB Gold Plus: NEGATIVE

## 2024-09-17 LAB — CBC WITH DIFFERENTIAL/PLATELET
Basophils Absolute: 0.1 x10E3/uL (ref 0.0–0.2)
Basos: 1 %
EOS (ABSOLUTE): 0.1 x10E3/uL (ref 0.0–0.4)
Eos: 1 %
Hematocrit: 51.3 % — ABNORMAL HIGH (ref 37.5–51.0)
Hemoglobin: 17 g/dL (ref 13.0–17.7)
Immature Grans (Abs): 0 x10E3/uL (ref 0.0–0.1)
Immature Granulocytes: 0 %
Lymphocytes Absolute: 2.3 x10E3/uL (ref 0.7–3.1)
Lymphs: 29 %
MCH: 28.7 pg (ref 26.6–33.0)
MCHC: 33.1 g/dL (ref 31.5–35.7)
MCV: 87 fL (ref 79–97)
Monocytes Absolute: 0.7 x10E3/uL (ref 0.1–0.9)
Monocytes: 9 %
Neutrophils Absolute: 4.6 x10E3/uL (ref 1.4–7.0)
Neutrophils: 60 %
Platelets: 184 x10E3/uL (ref 150–450)
RBC: 5.92 x10E6/uL — ABNORMAL HIGH (ref 4.14–5.80)
RDW: 13.9 % (ref 11.6–15.4)
WBC: 7.8 x10E3/uL (ref 3.4–10.8)

## 2024-09-17 LAB — HEPATITIS B SURFACE ANTIBODY,QUALITATIVE: Hep B Surface Ab, Qual: NONREACTIVE

## 2024-09-17 LAB — COMPREHENSIVE METABOLIC PANEL WITH GFR
ALT: 61 IU/L — ABNORMAL HIGH (ref 0–44)
AST: 59 IU/L — ABNORMAL HIGH (ref 0–40)
Albumin: 4.3 g/dL (ref 4.1–5.1)
Alkaline Phosphatase: 90 IU/L (ref 47–123)
BUN/Creatinine Ratio: 15 (ref 9–20)
BUN: 15 mg/dL (ref 6–20)
Bilirubin Total: 1.1 mg/dL (ref 0.0–1.2)
CO2: 23 mmol/L (ref 20–29)
Calcium: 9.6 mg/dL (ref 8.7–10.2)
Chloride: 103 mmol/L (ref 96–106)
Creatinine, Ser: 1.02 mg/dL (ref 0.76–1.27)
Globulin, Total: 2.9 g/dL (ref 1.5–4.5)
Glucose: 111 mg/dL — ABNORMAL HIGH (ref 70–99)
Potassium: 3.9 mmol/L (ref 3.5–5.2)
Sodium: 140 mmol/L (ref 134–144)
Total Protein: 7.2 g/dL (ref 6.0–8.5)
eGFR: 98 mL/min/1.73 (ref >59)

## 2024-09-17 LAB — HEPATITIS C ANTIBODY: Hep C Virus Ab: NONREACTIVE

## 2024-09-17 LAB — HEPATITIS B SURFACE ANTIGEN: Hepatitis B Surface Ag: NEGATIVE

## 2024-09-17 LAB — HIV ANTIBODY (ROUTINE TESTING W REFLEX): HIV Screen 4th Generation wRfx: NONREACTIVE

## 2024-09-18 ENCOUNTER — Ambulatory Visit: Payer: Self-pay | Admitting: Dermatology

## 2024-09-18 NOTE — Telephone Encounter (Signed)
-----   Message from [place] sent at 09/18/2024  1:33 PM EST ----- Please start Skyrizi for psoriasis ----- Message ----- From: Rebecka Memos Lab Results In Sent: 09/17/2024   7:36 AM EST To: Boneta Sharps, MD

## 2024-09-18 NOTE — Telephone Encounter (Signed)
 Patient advised labs WNL. He will reach out to [facility] concerning rx. Lonell RAMAN., RMA

## 2024-10-15 ENCOUNTER — Telehealth: Payer: Self-pay

## 2024-10-15 NOTE — Telephone Encounter (Signed)
 Patient's Skyrizi  PA approved. He has not heard from pharmacy about shipping yet. Patient is due for 4 week loading dose. Sample left at front desk for pick up.  LOT: [id number] EXP: 02/27/2025  Alan Pizza, RMA

## 2024-10-16 ENCOUNTER — Other Ambulatory Visit: Payer: Self-pay

## 2024-10-16 DIAGNOSIS — L409 Psoriasis, unspecified: Secondary | ICD-10-CM

## 2024-10-16 MED ORDER — SKYRIZI PEN 150 MG/ML ~~LOC~~ SOAJ: 150.0000 mg | SUBCUTANEOUS | 6 refills | Status: AC

## 2024-10-16 NOTE — Progress Notes (Signed)
 Per insurance requirements Skyrizi  transferred to [facility].

## 2025-01-13 ENCOUNTER — Ambulatory Visit: Admitting: Dermatology
